# Patient Record
Sex: Male | Born: 2000 | Race: Black or African American | Hispanic: No | Marital: Single
Health system: Southern US, Community
[De-identification: ages and names within clinical notes are randomized; demographics above are authoritative.]

---
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
## 2022-04-12 IMAGING — CT CT MAXILLOFACIAL W/O CM
3 series · 15 of 47 positions shown, 18 images · non-contrast
Comparison: None.

CLINICAL DATA: Status post trauma.

EXAM:
CT MAXILLOFACIAL WITHOUT CONTRAST
TECHNIQUE: Multidetector CT imaging of the maxillofacial structures was
performed. Multiplanar CT image reconstructions were also generated.

[Series 3: facial/ orbits 2.0 h30s · axial · 0.40mm/px · z∈[-164,-22]mm · 9 of 83 slices shown, 12 images]
[im 6/83  brain]
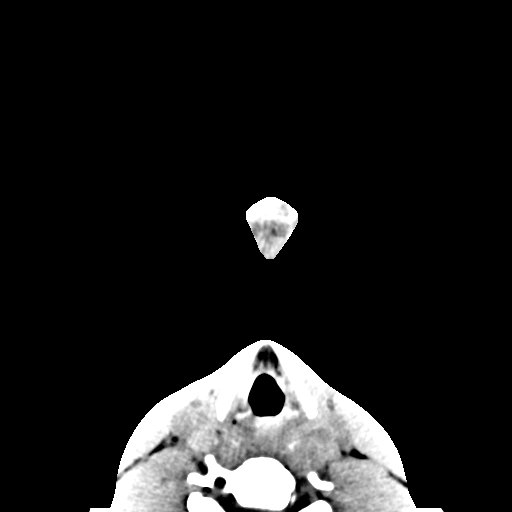
[im 6/83  bone]
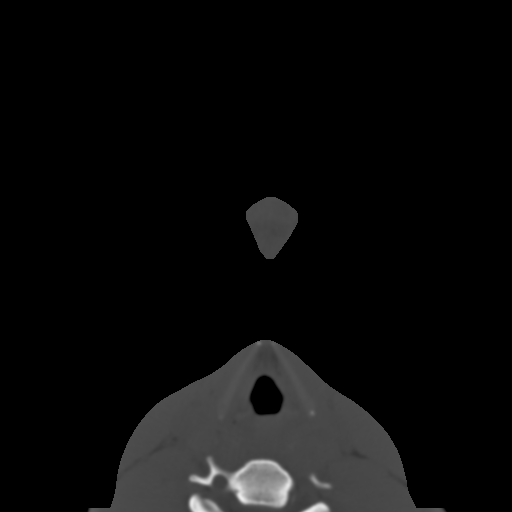
[im 15/83  bone]
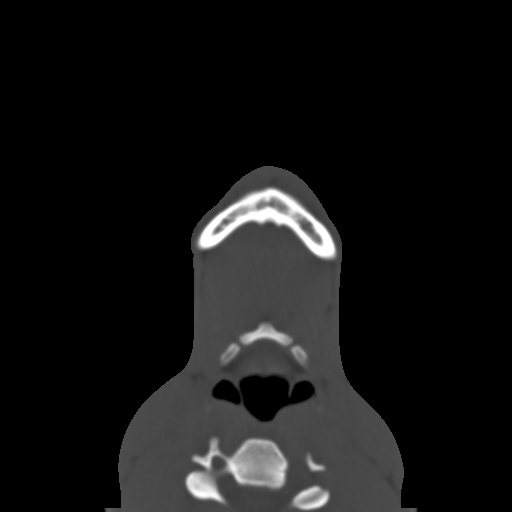
[im 23/83  bone]
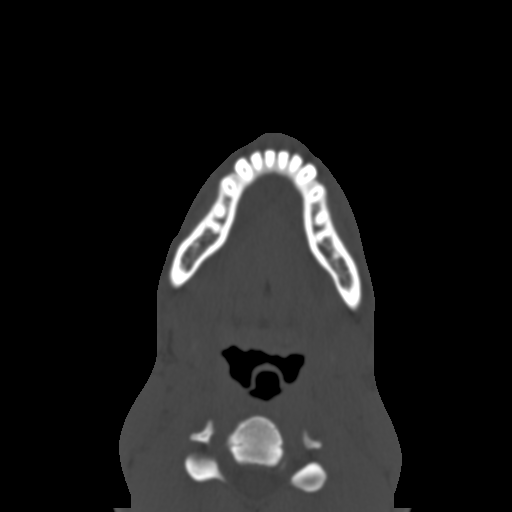
[im 32/83  bone]
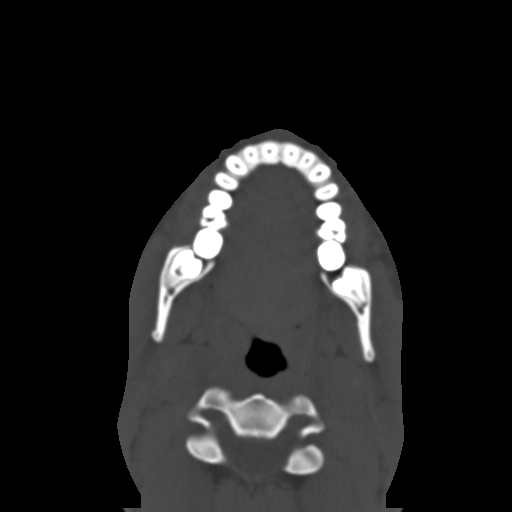
[im 43/83  brain]
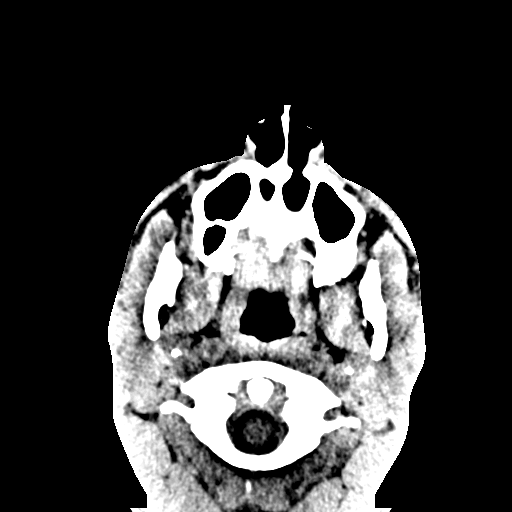
[im 43/83  bone]
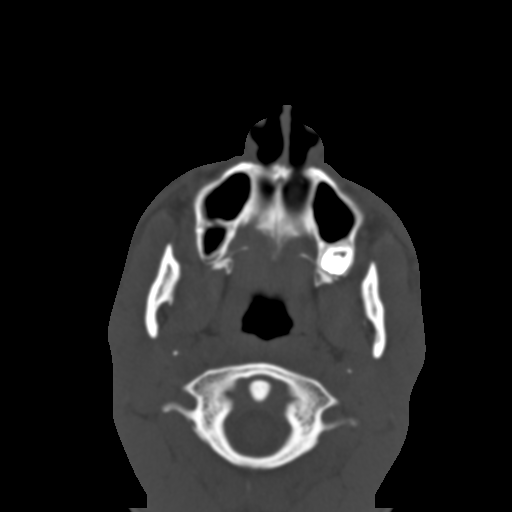
[im 51/83  bone]
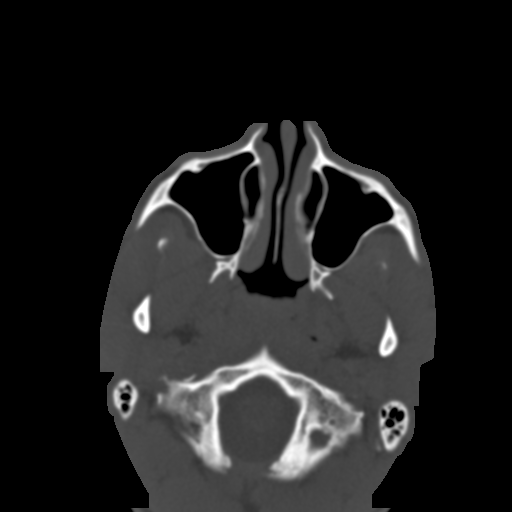
[im 60/83  bone]
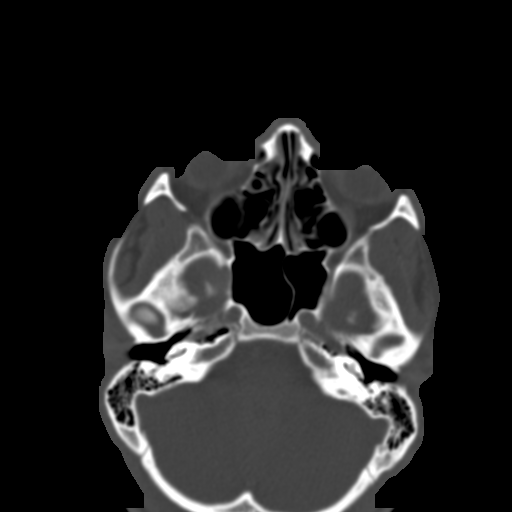
[im 68/83  bone]
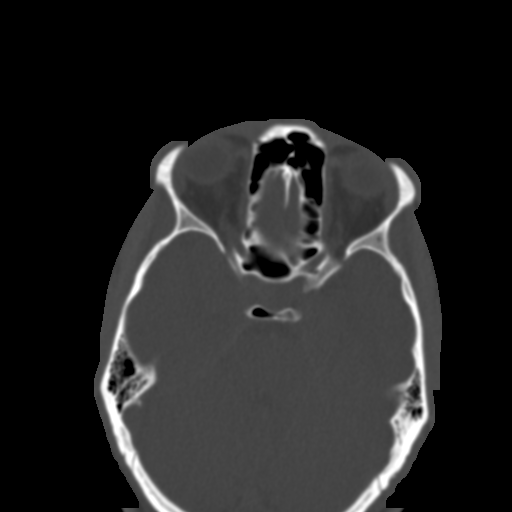
[im 77/83  brain]
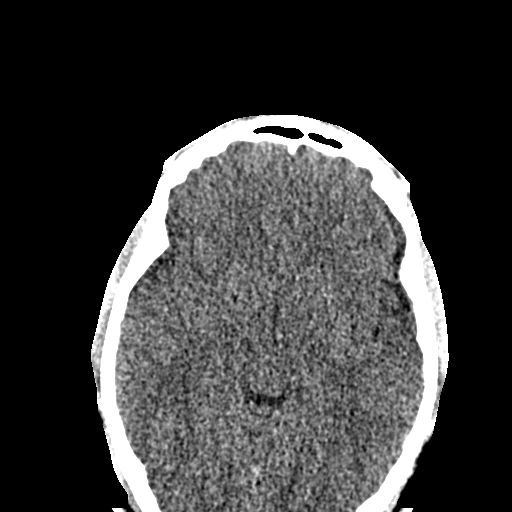
[im 77/83  bone]
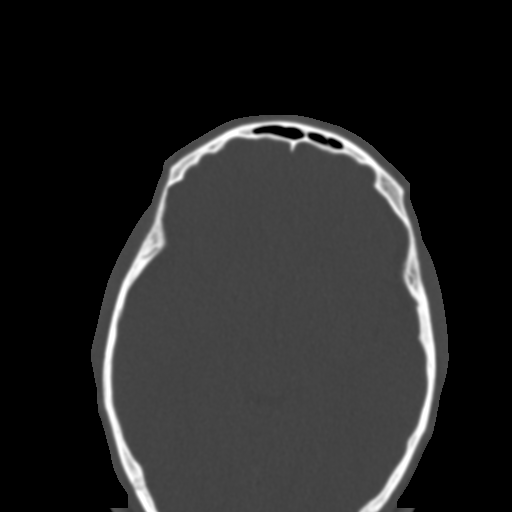

[Series 7: coronal soft tissue · coronal · 0.32mm/px · 3 of 107 slices shown]
[im 36/107  bone]
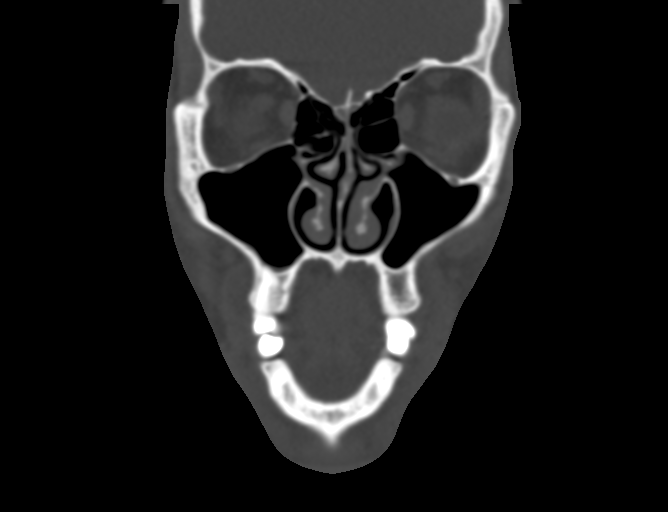
[im 48/107  bone]
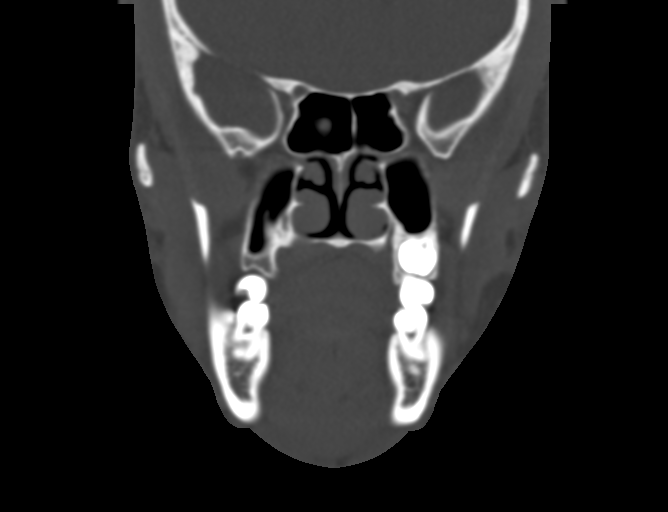
[im 59/107  bone]
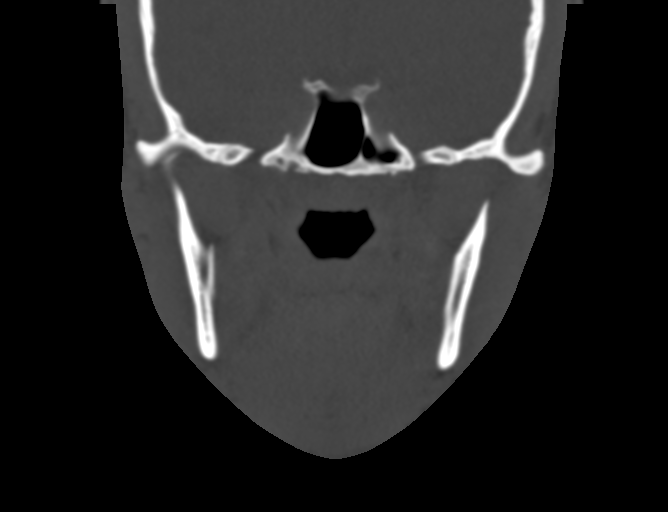

[Series 8: sagittal soft tissue · sagittal · 0.34mm/px · 3 of 92 slices shown]
[im 31/92  bone]
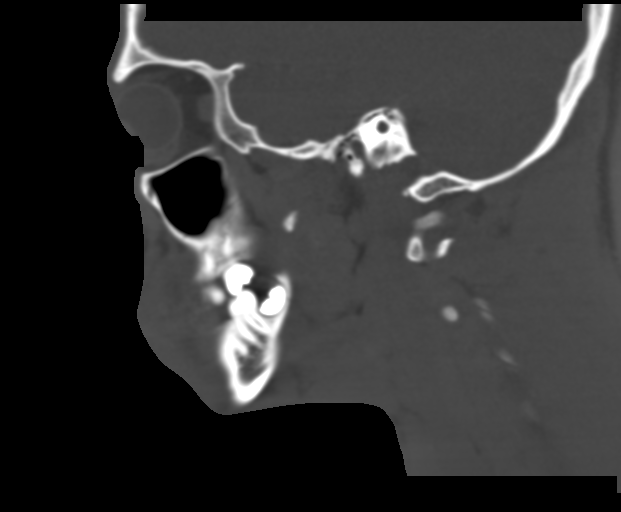
[im 46/92  bone]
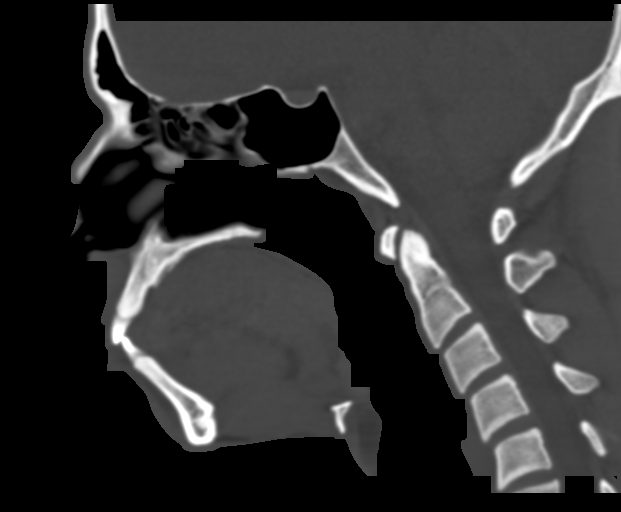
[im 61/92  bone]
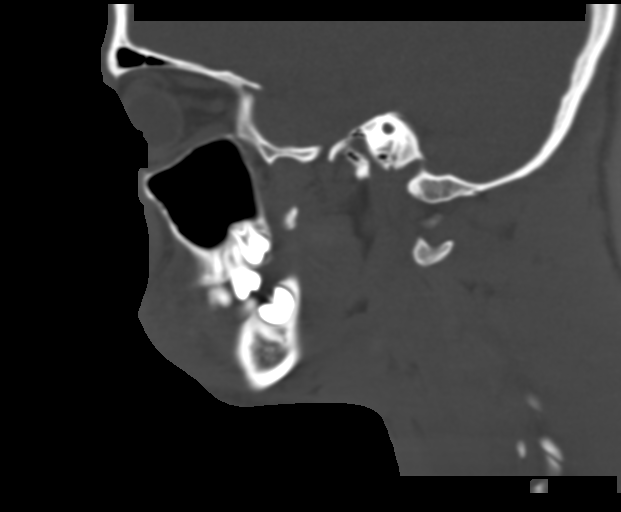

[15 of 47 positions shown; findings below may reference images not displayed]

FINDINGS: Osseous: A very small, nondisplaced fracture deformity is seen
involving the medial aspect of the body of the mandible on the right
(coronal reformatted CT images 54 through 57, CT series number
9/sagittal reformatted images 27 through 33, CT series number 10).
This extends into the region adjacent to the root of an adjacent
wisdom tooth.

Orbits: Negative. No traumatic or inflammatory finding.

Sinuses: There is very mild bilateral ethmoid sinus mucosal
thickening. A 5 mm x 6 mm lateral right maxillary sinus polyp versus
mucous retention cyst is noted.

Soft tissues: Negative.

Limited intracranial: No significant or unexpected finding.
IMPRESSION: Very small, nondisplaced fracture of medial aspect of the body of
the mandible on the right, as described above.
